# Patient Record
Sex: Male | Born: 1980 | Race: White | Hispanic: No | Marital: Single | Smoking: Former smoker
Health system: Southern US, Community
[De-identification: ages and names within clinical notes are randomized; demographics above are authoritative.]

## PROBLEM LIST (undated history)

## (undated) ENCOUNTER — Ambulatory Visit: Admission: EM | Payer: 59 | Source: Home / Self Care

## (undated) DIAGNOSIS — E119 Type 2 diabetes mellitus without complications: Secondary | ICD-10-CM

## (undated) DIAGNOSIS — E785 Hyperlipidemia, unspecified: Secondary | ICD-10-CM

## (undated) DIAGNOSIS — I10 Essential (primary) hypertension: Secondary | ICD-10-CM

## (undated) HISTORY — PX: APPENDECTOMY: SHX54

---

## 2004-04-07 ENCOUNTER — Emergency Department: Payer: Self-pay | Admitting: Emergency Medicine

## 2005-03-18 ENCOUNTER — Ambulatory Visit: Payer: Self-pay | Admitting: Gastroenterology

## 2005-12-07 ENCOUNTER — Emergency Department: Payer: Self-pay

## 2008-07-03 ENCOUNTER — Ambulatory Visit: Payer: Self-pay | Admitting: Family Medicine

## 2008-07-27 ENCOUNTER — Ambulatory Visit: Payer: Self-pay | Admitting: Family Medicine

## 2009-12-01 ENCOUNTER — Emergency Department: Payer: Self-pay | Admitting: Emergency Medicine

## 2010-12-22 ENCOUNTER — Ambulatory Visit: Payer: Self-pay

## 2014-05-10 ENCOUNTER — Ambulatory Visit: Payer: Self-pay | Admitting: Physician Assistant

## 2014-05-10 LAB — RAPID STREP-A WITH REFLX: Micro Text Report: NEGATIVE

## 2014-05-13 LAB — BETA STREP CULTURE(ARMC)

## 2015-09-09 ENCOUNTER — Ambulatory Visit (INDEPENDENT_AMBULATORY_CARE_PROVIDER_SITE_OTHER): Payer: Self-pay

## 2015-09-09 ENCOUNTER — Ambulatory Visit: Payer: Self-pay

## 2015-09-09 ENCOUNTER — Ambulatory Visit
Admission: EM | Admit: 2015-09-09 | Discharge: 2015-09-09 | Disposition: A | Discharge status: Home / Self Care | Payer: Self-pay | Attending: Family Medicine | Admitting: Family Medicine

## 2015-09-09 DIAGNOSIS — M25551 Pain in right hip: Secondary | ICD-10-CM

## 2015-09-09 DIAGNOSIS — M25552 Pain in left hip: Secondary | ICD-10-CM

## 2015-09-09 HISTORY — DX: Hyperlipidemia, unspecified: E78.5

## 2015-09-09 MED ORDER — OXYCODONE-ACETAMINOPHEN 5-325 MG PO TABS: 1.0000 | ORAL_TABLET | Freq: Three times a day (TID) | ORAL | Status: AC | PRN
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
## 2017-01-13 DIAGNOSIS — L400 Psoriasis vulgaris: Secondary | ICD-10-CM | POA: Diagnosis not present

## 2017-01-13 DIAGNOSIS — L918 Other hypertrophic disorders of the skin: Secondary | ICD-10-CM | POA: Diagnosis not present

## 2017-05-22 DIAGNOSIS — G4733 Obstructive sleep apnea (adult) (pediatric): Secondary | ICD-10-CM | POA: Diagnosis not present

## 2017-09-04 DIAGNOSIS — H6592 Unspecified nonsuppurative otitis media, left ear: Secondary | ICD-10-CM | POA: Diagnosis not present

## 2017-09-04 DIAGNOSIS — J029 Acute pharyngitis, unspecified: Secondary | ICD-10-CM | POA: Diagnosis not present

## 2017-10-22 DIAGNOSIS — R69 Illness, unspecified: Secondary | ICD-10-CM | POA: Diagnosis not present

## 2017-10-22 DIAGNOSIS — E782 Mixed hyperlipidemia: Secondary | ICD-10-CM | POA: Diagnosis not present

## 2017-10-22 DIAGNOSIS — H1132 Conjunctival hemorrhage, left eye: Secondary | ICD-10-CM | POA: Diagnosis not present

## 2017-12-10 DIAGNOSIS — G4733 Obstructive sleep apnea (adult) (pediatric): Secondary | ICD-10-CM | POA: Diagnosis not present

## 2017-12-16 DIAGNOSIS — G4733 Obstructive sleep apnea (adult) (pediatric): Secondary | ICD-10-CM | POA: Diagnosis not present

## 2017-12-17 DIAGNOSIS — G4733 Obstructive sleep apnea (adult) (pediatric): Secondary | ICD-10-CM | POA: Diagnosis not present

## 2018-01-27 ENCOUNTER — Ambulatory Visit
Admission: RE | Admit: 2018-01-27 | Discharge: 2018-01-27 | Disposition: A | Discharge status: Home / Self Care | Payer: 59 | Source: Ambulatory Visit | Attending: Family Medicine | Admitting: Family Medicine

## 2018-01-27 ENCOUNTER — Other Ambulatory Visit: Payer: Self-pay | Admitting: Family Medicine

## 2018-01-27 ENCOUNTER — Ambulatory Visit
Admission: RE | Admit: 2018-01-27 | Discharge: 2018-01-27 | Disposition: A | Discharge status: Home / Self Care | Payer: 59 | Source: Ambulatory Visit

## 2018-01-27 DIAGNOSIS — R0989 Other specified symptoms and signs involving the circulatory and respiratory systems: Secondary | ICD-10-CM

## 2018-01-27 DIAGNOSIS — J9811 Atelectasis: Secondary | ICD-10-CM | POA: Diagnosis not present

## 2018-01-27 DIAGNOSIS — Z87891 Personal history of nicotine dependence: Secondary | ICD-10-CM | POA: Diagnosis not present

## 2018-03-10 DIAGNOSIS — G4733 Obstructive sleep apnea (adult) (pediatric): Secondary | ICD-10-CM | POA: Diagnosis not present

## 2018-04-26 DIAGNOSIS — L400 Psoriasis vulgaris: Secondary | ICD-10-CM | POA: Diagnosis not present

## 2018-05-17 DIAGNOSIS — Z96641 Presence of right artificial hip joint: Secondary | ICD-10-CM | POA: Diagnosis not present

## 2018-05-17 DIAGNOSIS — M87052 Idiopathic aseptic necrosis of left femur: Secondary | ICD-10-CM | POA: Diagnosis not present

## 2018-05-17 DIAGNOSIS — Z471 Aftercare following joint replacement surgery: Secondary | ICD-10-CM | POA: Diagnosis not present

## 2018-06-22 DIAGNOSIS — G4733 Obstructive sleep apnea (adult) (pediatric): Secondary | ICD-10-CM | POA: Diagnosis not present

## 2018-07-14 DIAGNOSIS — B9689 Other specified bacterial agents as the cause of diseases classified elsewhere: Secondary | ICD-10-CM | POA: Diagnosis not present

## 2018-07-14 DIAGNOSIS — J019 Acute sinusitis, unspecified: Secondary | ICD-10-CM | POA: Diagnosis not present

## 2018-08-09 ENCOUNTER — Other Ambulatory Visit: Payer: Self-pay

## 2018-08-09 ENCOUNTER — Ambulatory Visit
Admission: EM | Admit: 2018-08-09 | Discharge: 2018-08-09 | Disposition: A | Discharge status: Home / Self Care | Payer: 59 | Attending: Family Medicine | Admitting: Family Medicine

## 2018-08-09 DIAGNOSIS — Z029 Encounter for administrative examinations, unspecified: Secondary | ICD-10-CM

## 2018-08-09 NOTE — ED Provider Notes (Signed)
[facility]    CSN: [id number] Arrival date & time: 08/09/18  1937     History   Chief Complaint Chief Complaint  Patient presents with  . Letter for School/Work    HPI TAHER WILCH is a 38 y.o. male.   38 yo male presents for work note. 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 Did not have any symptoms and feels fine, just needs a note so he can go back to work.   The history is provided by the patient.    Past Medical History:  Diagnosis Date  . Hyperlipemia     There are no active problems to display for this patient.   Past Surgical History:  Procedure Laterality Date  . APPENDECTOMY         Home Medications    Prior to Admission medications   Medication Sig Start Date End Date Taking? Authorizing Provider  cetirizine (ZYRTEC) 10 MG tablet Take 10 mg by mouth daily.    [provider]  oxyCODONE-acetaminophen (ROXICET) 5-325 MG tablet Take 1 tablet by mouth every 8 (eight) hours as needed for moderate pain or severe pain (Do not drive or operate heavy machinery while taking as can cause drowsiness.). 09/09/15   Renford Dills, NP  simvastatin (ZOCOR) 10 MG tablet Take 10 mg by mouth daily.    [provider]    Family History Family History  Problem Relation Age of Onset  . Kidney disease Mother     Social History Social History   Tobacco Use  . Smoking status: Current Every Day Smoker    Types: E-cigarettes, Cigars  . Smokeless tobacco: Never Used  Substance Use Topics  . Alcohol use: Yes  . Drug use: No     Allergies   Patient has no known allergies.   Review of Systems Review of Systems   Physical Exam Triage Vital Signs ED Triage Vitals  Enc Vitals Group     BP 08/09/18 1011 (!) 148/101     Pulse Rate 08/09/18 1011 100     Resp 08/09/18 1011 18     Temp 08/09/18 1011 99.1 F (37.3 C)     Temp Source 08/09/18 1011 Oral     SpO2 08/09/18 1011 100 %     Weight 08/09/18  1014 255 lb (115.7 kg)     Height 08/09/18 1014 5\' 11"  (1.803 m)     Head Circumference --      Peak Flow --      Pain Score 08/09/18 1014 0     Pain Loc --      Pain Edu? --      Excl. in GC? --    No data found.  Updated Vital Signs BP (!) 148/101 (BP Location: Right Arm)   Pulse 100   Temp 99.1 F (37.3 C) (Oral)   Resp 18   Ht 5\' 11"  (1.803 m)   Wt 115.7 kg   SpO2 100%   BMI 35.57 kg/m   Visual Acuity Right Eye Distance:   Left Eye Distance:   Bilateral Distance:    Right Eye Near:   Left Eye Near:    Bilateral Near:     Physical Exam Vitals signs and nursing note reviewed.      UC Treatments / Results  Labs (all labs ordered are listed, but only abnormal results are displayed) Labs Reviewed - No data to display  EKG None  Radiology No results  found.  Procedures Procedures (including critical care time)  Medications Ordered in UC Medications - No data to display  Initial Impression / Assessment and Plan / UC Course  I have reviewed the triage vital signs and the nursing notes.  Pertinent labs & imaging results that were available during my care of the patient were reviewed by me and considered in my medical decision making (see chart for details).      Final Clinical Impressions(s) / UC Diagnoses   Final diagnoses:  Administrative encounter    ED Prescriptions    None      1. Note printed for return to work.   Controlled Substance Prescriptions [place] Controlled Substance Registry consulted? Not Applicable   Payton Mccallum, MD 08/09/18 1147
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
## 2019-01-06 DIAGNOSIS — Z79899 Other long term (current) drug therapy: Secondary | ICD-10-CM | POA: Diagnosis not present

## 2019-01-06 DIAGNOSIS — L400 Psoriasis vulgaris: Secondary | ICD-10-CM | POA: Diagnosis not present

## 2019-01-12 DIAGNOSIS — G4733 Obstructive sleep apnea (adult) (pediatric): Secondary | ICD-10-CM | POA: Diagnosis not present

## 2019-02-03 DIAGNOSIS — L400 Psoriasis vulgaris: Secondary | ICD-10-CM | POA: Diagnosis not present

## 2019-02-07 IMAGING — CR DG CHEST 2V
3 series · 3 of 3 positions shown · non-contrast
Comparison: 12/01/2009

CLINICAL DATA: Gurgling or bubbling sensation left posterior chest

EXAM:
CHEST - 2 VIEW

[chest pa]
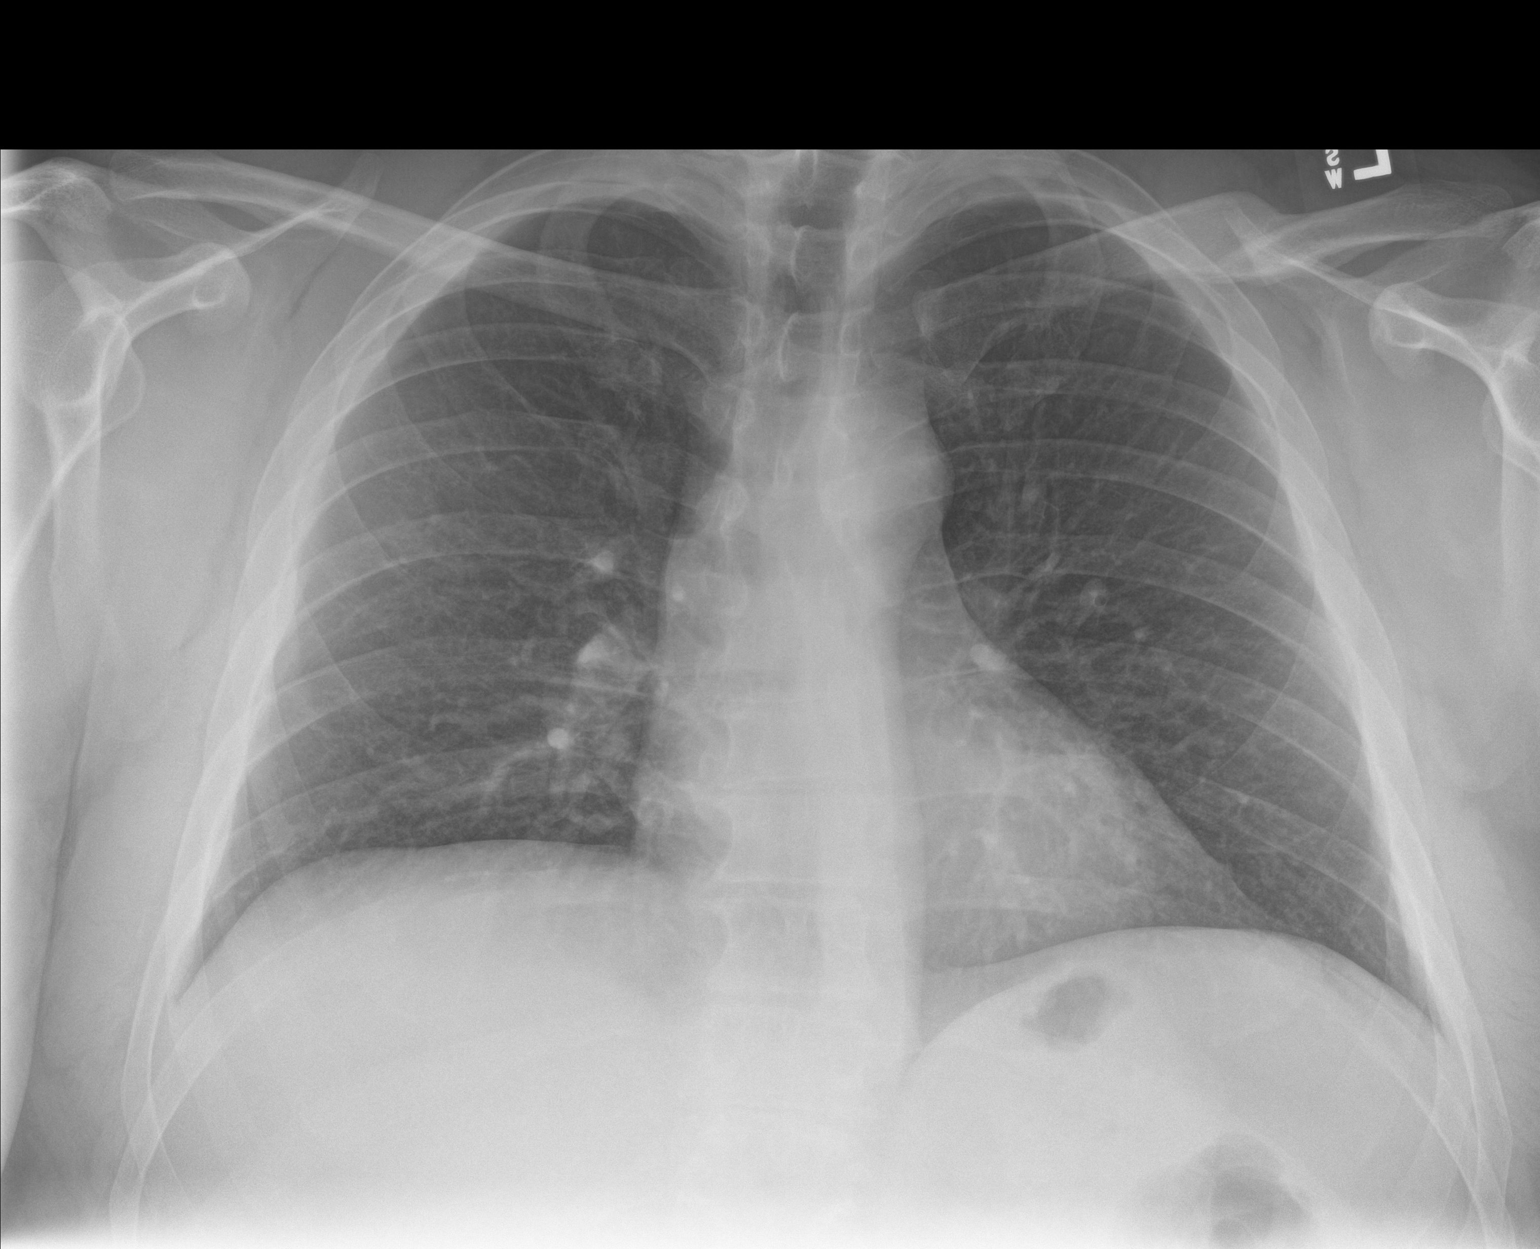

[chest lat (1 of 2)]
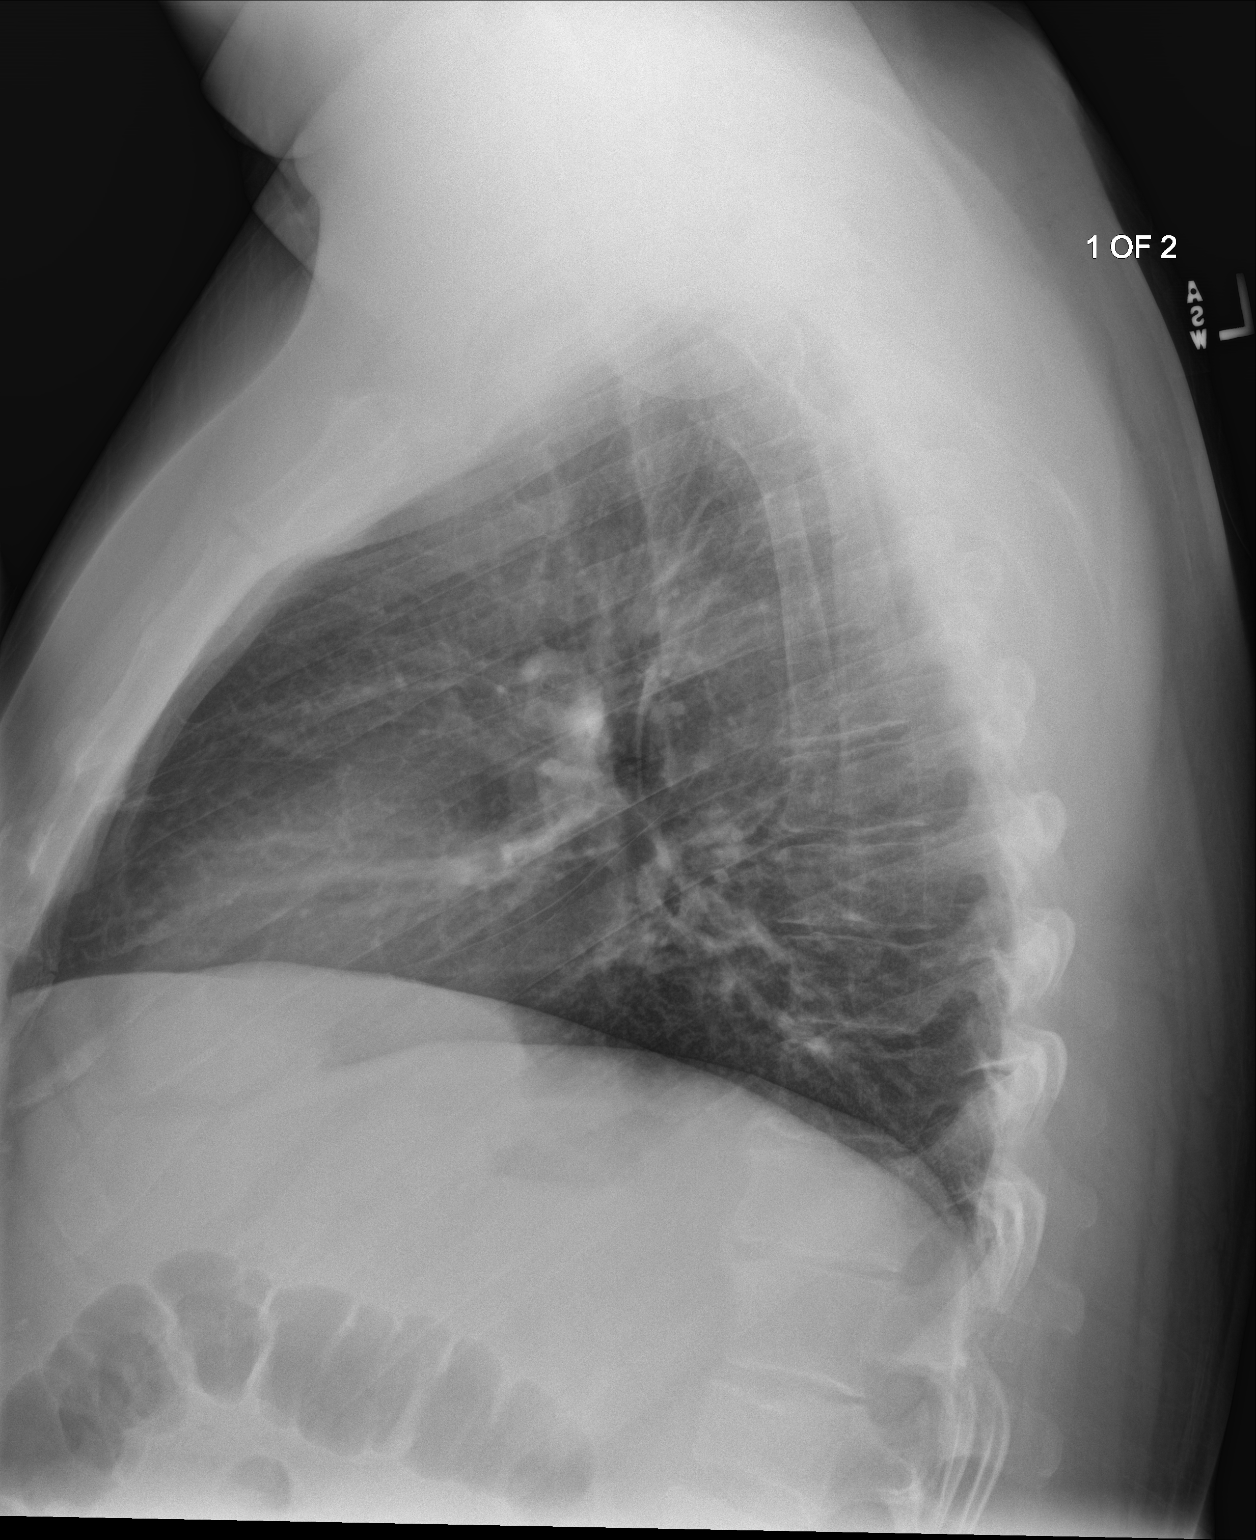

[chest lat (2 of 2)]
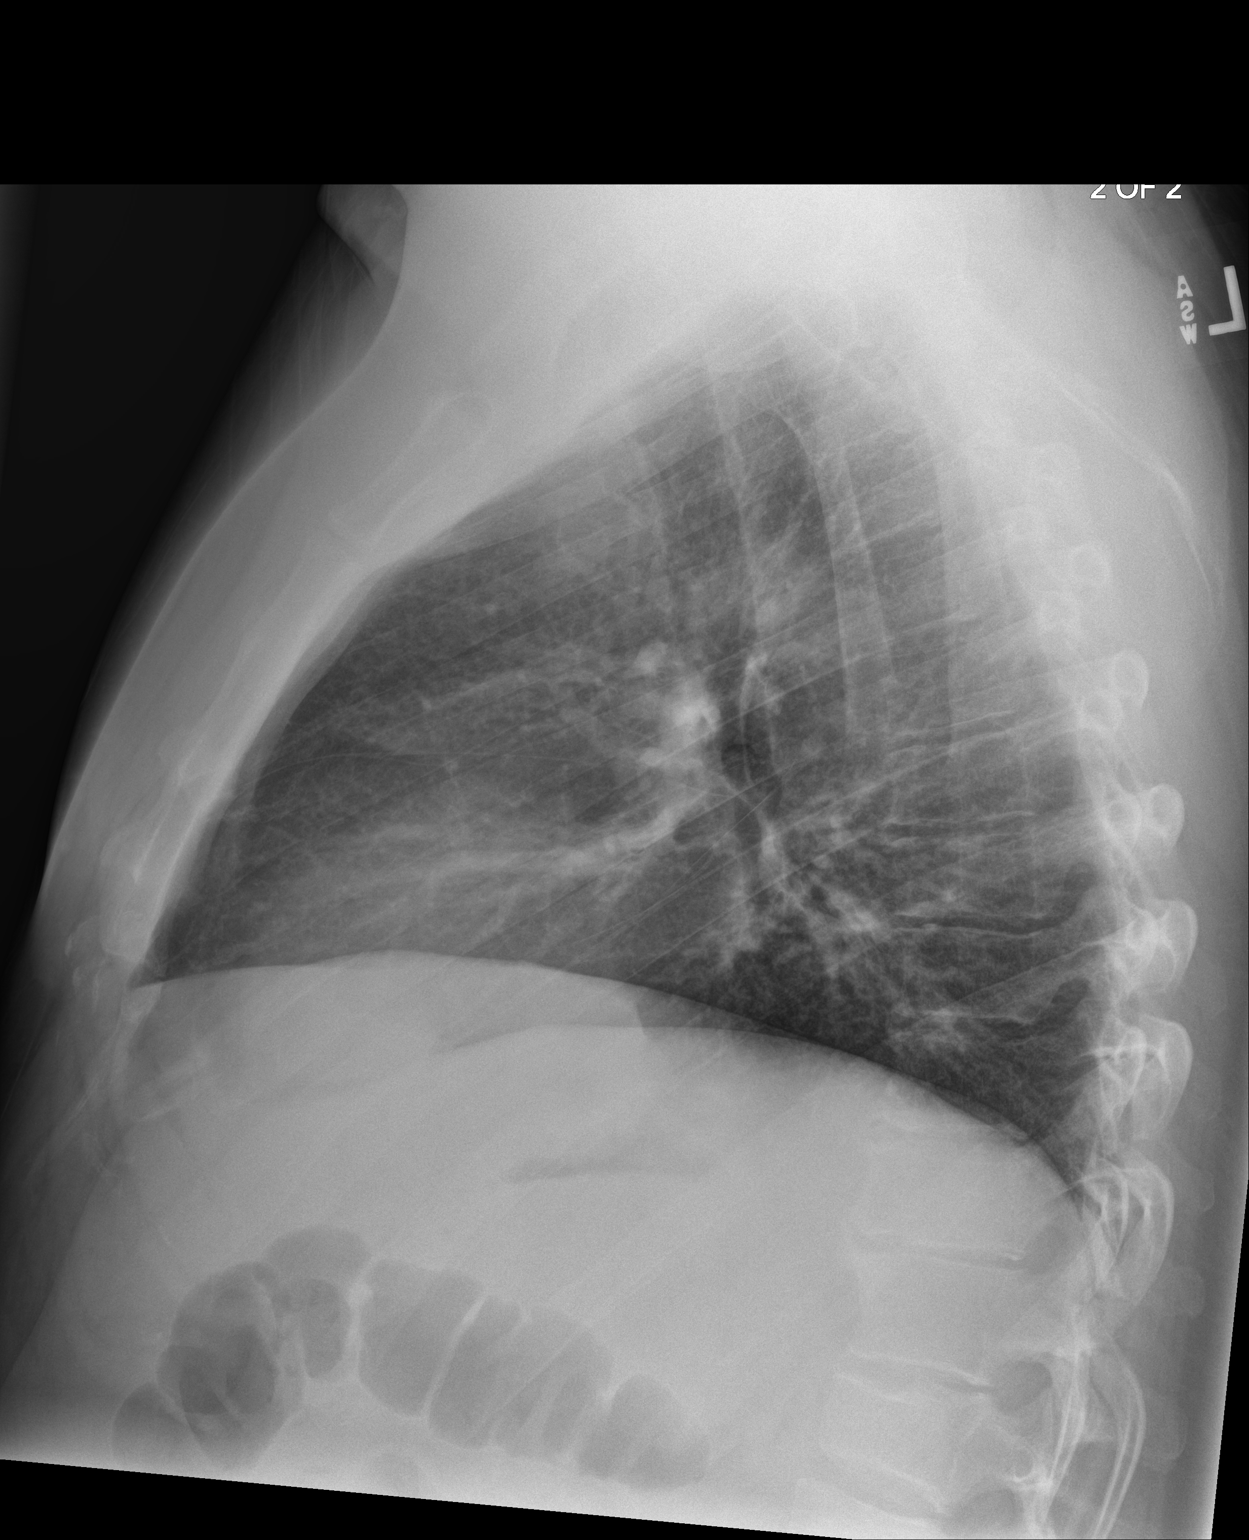

[3 of 3 positions shown; findings below may reference images not displayed]

FINDINGS: Streaky atelectasis at the left base. No focal consolidation or
effusion. Normal heart size. No pneumothorax.
IMPRESSION: Streaky left basilar atelectasis.

## 2019-02-10 DIAGNOSIS — L400 Psoriasis vulgaris: Secondary | ICD-10-CM | POA: Diagnosis not present

## 2019-02-11 DIAGNOSIS — G4733 Obstructive sleep apnea (adult) (pediatric): Secondary | ICD-10-CM | POA: Diagnosis not present

## 2019-03-14 DIAGNOSIS — G4733 Obstructive sleep apnea (adult) (pediatric): Secondary | ICD-10-CM | POA: Diagnosis not present

## 2019-04-12 DIAGNOSIS — G4733 Obstructive sleep apnea (adult) (pediatric): Secondary | ICD-10-CM | POA: Diagnosis not present

## 2019-05-13 DIAGNOSIS — G4733 Obstructive sleep apnea (adult) (pediatric): Secondary | ICD-10-CM | POA: Diagnosis not present

## 2019-05-20 DIAGNOSIS — E782 Mixed hyperlipidemia: Secondary | ICD-10-CM | POA: Diagnosis not present

## 2019-05-20 DIAGNOSIS — I10 Essential (primary) hypertension: Secondary | ICD-10-CM | POA: Diagnosis not present

## 2019-05-27 DIAGNOSIS — M87052 Idiopathic aseptic necrosis of left femur: Secondary | ICD-10-CM | POA: Diagnosis not present

## 2022-04-11 ENCOUNTER — Encounter: Payer: Self-pay | Admitting: Emergency Medicine

## 2022-04-11 ENCOUNTER — Ambulatory Visit
Admission: EM | Admit: 2022-04-11 | Discharge: 2022-04-11 | Disposition: A | Discharge status: Home / Self Care | Payer: 59

## 2022-04-11 DIAGNOSIS — J019 Acute sinusitis, unspecified: Secondary | ICD-10-CM

## 2022-04-11 DIAGNOSIS — J209 Acute bronchitis, unspecified: Secondary | ICD-10-CM

## 2022-04-11 DIAGNOSIS — B9689 Other specified bacterial agents as the cause of diseases classified elsewhere: Secondary | ICD-10-CM

## 2022-04-11 HISTORY — DX: Type 2 diabetes mellitus without complications: E11.9

## 2022-04-11 HISTORY — DX: Essential (primary) hypertension: I10

## 2022-04-11 MED ORDER — PREDNISONE 20 MG PO TABS: ORAL_TABLET | ORAL | 0 refills | Status: AC

## 2022-04-11 MED ORDER — AMOXICILLIN-POT CLAVULANATE 875-125 MG PO TABS: 1.0000 | ORAL_TABLET | Freq: Two times a day (BID) | ORAL | 0 refills | Status: DC

## 2022-04-11 NOTE — Discharge Instructions (Signed)
Follow up here or with your primary care provider if your symptoms are worsening or not improving with treatment.     

## 2022-04-11 NOTE — ED Triage Notes (Signed)
Patient c/o sinus congestion and pressure for a month.  Patient c/o cough and chest congestion that has gotten worse over the past week.  Patient denies fevers.

## 2022-04-11 NOTE — ED Provider Notes (Signed)
[facility]    CSN: [id number] Arrival date & time: 04/11/22  0803      History   Chief Complaint Chief Complaint  Patient presents with   Cough    HPI Miguel Zamora is a 41 y.o. male.    Cough   Presents to urgent care with complaint of sinus congestion and pressure for 1 week.  He complains of cough and chest congestion worsening over the past 1 month.  Endorses pain and popping in his ears.  Sinus pressure in his face especially under his eyes.  Cough is worse in the morning and productive.  Denies cough keeps him awake at night.  Denies fever, chills, myalgias.  Denies nausea, vomiting, diarrhea.  Past Medical History:  Diagnosis Date   Diabetes mellitus without complication (HCC)    Hyperlipemia    Hypertension     There are no problems to display for this patient.   Past Surgical History:  Procedure Laterality Date   APPENDECTOMY         Home Medications    Prior to Admission medications   Medication Sig Start Date End Date Taking? Authorizing Provider  carvedilol (COREG) 6.25 MG tablet Take by mouth. 03/28/22  Yes [provider]  Ferrous Sulfate (IRON PO) Take 1 tablet by mouth daily. 04/30/16  Yes [provider]  metFORMIN (GLUCOPHAGE) 1000 MG tablet Take by mouth. 03/28/22  Yes [provider]  TREMFYA 100 MG/ML SOPN Inject into the skin.   Yes [provider]  cetirizine (ZYRTEC) 10 MG tablet Take 10 mg by mouth daily.    [provider]  oxyCODONE-acetaminophen (ROXICET) 5-325 MG tablet Take 1 tablet by mouth every 8 (eight) hours as needed for moderate pain or severe pain (Do not drive or operate heavy machinery while taking as can cause drowsiness.). 09/09/15   Renford Dills, NP  simvastatin (ZOCOR) 10 MG tablet Take 10 mg by mouth daily.    [provider]    Family History Family History  Problem Relation Age of Onset   Kidney disease Mother     Social  History Social History   Tobacco Use   Smoking status: Former    Types: E-cigarettes, Cigars   Smokeless tobacco: Never  Vaping Use   Vaping Use: Every day   Substances: Nicotine  Substance Use Topics   Alcohol use: Yes   Drug use: No     Allergies   Patient has no known allergies.   Review of Systems Review of Systems  Respiratory:  Positive for cough.      Physical Exam Triage Vital Signs ED Triage Vitals  Enc Vitals Group     BP 04/11/22 0828 (!) 137/92     Pulse Rate 04/11/22 0828 90     Resp 04/11/22 0828 15     Temp 04/11/22 0828 99 F (37.2 C)     Temp Source 04/11/22 0828 Oral     SpO2 04/11/22 0828 96 %     Weight 04/11/22 0827 245 lb (111.1 kg)     Height 04/11/22 0827 5\' 11"  (1.803 m)     Head Circumference --      Peak Flow --      Pain Score 04/11/22 0827 0     Pain Loc --      Pain Edu? --      Excl. in GC? --    No data found.  Updated Vital Signs BP (!) 137/92 (BP Location: Right Arm)  Pulse 90   Temp 99 F (37.2 C) (Oral)   Resp 15   Ht 5\' 11"  (1.803 m)   Wt 245 lb (111.1 kg)   SpO2 96%   BMI 34.17 kg/m   Visual Acuity Right Eye Distance:   Left Eye Distance:   Bilateral Distance:    Right Eye Near:   Left Eye Near:    Bilateral Near:     Physical Exam Vitals reviewed.  Constitutional:      Appearance: Normal appearance.  HENT:     Right Ear: Tympanic membrane is not erythematous.     Left Ear: Tympanic membrane is not erythematous.     Ears:     Comments: Left TM is abnormal appearing. 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 Cardiovascular:     Rate and Rhythm: Normal rate and regular rhythm.     Pulses: Normal pulses.     Heart sounds: Normal heart sounds.  Pulmonary:     Effort: Pulmonary effort is normal.     Breath sounds: Normal breath sounds.  Skin:    General: Skin is warm and dry.  Neurological:     General: No focal deficit present.     Mental Status: He is alert and  oriented to person, place, and time.  Psychiatric:        Mood and Affect: Mood normal.        Behavior: Behavior normal.      UC Treatments / Results  Labs (all labs ordered are listed, but only abnormal results are displayed) Labs Reviewed - No data to display  EKG   Radiology No results found.  Procedures Procedures (including critical care time)  Medications Ordered in UC Medications - No data to display  Initial Impression / Assessment and Plan / UC Course  I have reviewed the triage vital signs and the nursing notes.  Pertinent labs & imaging results that were available during my care of the patient were reviewed by me and considered in my medical decision making (see chart for details).   Patient is afebrile here without recent antipyretics. Satting well on room air. Overall is well appearing, well hydrated, without respiratory distress. Pulmonary exam is unremarkable.  Lungs CTAB without wheezing or rhonchi.  Left TM is abnormal appearing.  Patient endorses history of TM perforation possibly secondary to multiple TM tubes placed during childhood.  There is no TM erythema or purulence.  Some pharyngeal erythema with tonsils 2+ bilaterally.  No peritonsillar exudate is observed.  Suspect past viral URI with cough.  Concern for secondary bacterial infection given duration of symptoms and will treat with Augmentin x 7 days.  Given severity of sinus symptoms, will also add a course of prednisone to reduce sinus inflammation.  Final Clinical Impressions(s) / UC Diagnoses   Final diagnoses:  None   Discharge Instructions   None    ED Prescriptions   None    PDMP not reviewed this encounter.   , Charma Igo 04/11/22 [phone]

## 2022-04-26 ENCOUNTER — Ambulatory Visit: Admit: 2022-04-26 | Discharge: 2022-04-26 | Discharge status: Absent without leave | Payer: 59

## 2022-04-28 ENCOUNTER — Ambulatory Visit
Admission: EM | Admit: 2022-04-28 | Discharge: 2022-04-28 | Disposition: A | Discharge status: Home / Self Care | Payer: 59 | Attending: Family Medicine | Admitting: Family Medicine

## 2022-04-28 ENCOUNTER — Ambulatory Visit (INDEPENDENT_AMBULATORY_CARE_PROVIDER_SITE_OTHER): Payer: 59

## 2022-04-28 VITALS — BP 159/97 | HR 103 | Temp 98.7°F | Resp 16 | Ht 71.0 in | Wt 244.9 lb

## 2022-04-28 DIAGNOSIS — J40 Bronchitis, not specified as acute or chronic: Secondary | ICD-10-CM

## 2022-04-28 DIAGNOSIS — R059 Cough, unspecified: Secondary | ICD-10-CM

## 2022-04-28 MED ORDER — AZITHROMYCIN 250 MG PO TABS: 250.0000 mg | ORAL_TABLET | Freq: Every day | ORAL | 0 refills | Status: AC

## 2022-04-28 MED ORDER — PREDNISONE 50 MG PO TABS: 50.0000 mg | ORAL_TABLET | Freq: Every day | ORAL | 0 refills | Status: AC

## 2022-04-28 MED ORDER — ALBUTEROL SULFATE HFA 108 (90 BASE) MCG/ACT IN AERS: 2.0000 | INHALATION_SPRAY | RESPIRATORY_TRACT | 0 refills | Status: AC | PRN

## 2022-04-28 NOTE — ED Triage Notes (Signed)
Pt c/o cough. He was seen for this on 04/11/22 and treated with abx. He states he is feeling better but he still has some chest congestion and cough.

## 2022-04-28 NOTE — ED Provider Notes (Signed)
[facility]    CSN: [id number] Arrival date & time: 04/28/22  1554      History   Chief Complaint Chief Complaint  Patient presents with   Appointment   Cough    HPI Miguel Zamora is a 41 y.o. male.   HPI   Miguel Zamora presents for ongoing cough for the past 1-2 months. States his son has a similar cough.  Took antibiotics and steroids which helped but did not take the cough completely away.  Cough is worse early in the morning.   No  new medications or supplements.  No reflux symptoms.  Has been using over-the-counter cough medicines without relief.  There is no shortness of breath, chest discomfort, vomiting, nausea.     Past Medical History:  Diagnosis Date   Diabetes mellitus without complication (HCC)    Hyperlipemia    Hypertension     There are no problems to display for this patient.   Past Surgical History:  Procedure Laterality Date   APPENDECTOMY         Home Medications    Prior to Admission medications   Medication Sig Start Date End Date Taking? Authorizing Provider  albuterol (VENTOLIN HFA) 108 (90 Base) MCG/ACT inhaler Inhale 2 puffs into the lungs every 4 (four) hours as needed. 04/28/22  Yes Kayvon Mo, DO  azithromycin (ZITHROMAX Z-PAK) 250 MG tablet Take 1 tablet (250 mg total) by mouth daily. Take 2 tablets on day 1 04/28/22  Yes Taysen Bushart, DO  carvedilol (COREG) 6.25 MG tablet Take by mouth. 03/28/22  Yes [provider]  Ferrous Sulfate (IRON PO) Take 1 tablet by mouth daily. 04/30/16  Yes [provider]  fexofenadine (ALLEGRA) 60 MG tablet Take 60 mg by mouth 2 (two) times daily.   Yes [provider]  metFORMIN (GLUCOPHAGE) 1000 MG tablet Take by mouth. 03/28/22  Yes [provider]  predniSONE (DELTASONE) 50 MG tablet Take 1 tablet (50 mg total) by mouth daily for 5 days. 04/28/22 05/03/22 Yes Keshaun Dubey, DO  simvastatin (ZOCOR) 10 MG tablet Take 10 mg by mouth daily.   Yes  [provider]  TREMFYA 100 MG/ML SOPN Inject into the skin.   Yes [provider]  cetirizine (ZYRTEC) 10 MG tablet Take 10 mg by mouth daily.    [provider]  oxyCODONE-acetaminophen (ROXICET) 5-325 MG tablet Take 1 tablet by mouth every 8 (eight) hours as needed for moderate pain or severe pain (Do not drive or operate heavy machinery while taking as can cause drowsiness.). 09/09/15   Renford Dills, NP    Family History Family History  Problem Relation Age of Onset   Kidney disease Mother     Social History Social History   Tobacco Use   Smoking status: Former    Types: E-cigarettes, Cigars   Smokeless tobacco: Never  Vaping Use   Vaping Use: Every day   Substances: Nicotine  Substance Use Topics   Alcohol use: Yes   Drug use: No     Allergies   Patient has no known allergies.   Review of Systems Review of Systems: negative unless otherwise stated in HPI.      Physical Exam Triage Vital Signs ED Triage Vitals  Enc Vitals Group     BP 04/28/22 1625 (!) 159/97     Pulse Rate 04/28/22 1625 (!) 103     Resp 04/28/22 1625 16     Temp 04/28/22 1625 98.7 F (37.1 C)  Temp src --      SpO2 04/28/22 1625 97 %     Weight 04/28/22 1625 244 lb 14.9 oz (111.1 kg)     Height 04/28/22 1625 5\' 11"  (1.803 m)     Head Circumference --      Peak Flow --      Pain Score 04/28/22 1624 0     Pain Loc --      Pain Edu? --      Excl. in GC? --    No data found.  Updated Vital Signs BP (!) 159/97 (BP Location: Right Arm)   Pulse (!) 103   Temp 98.7 F (37.1 C)   Resp 16   Ht 5\' 11"  (1.803 m)   Wt 111.1 kg   SpO2 97%   BMI 34.16 kg/m   Visual Acuity Right Eye Distance:   Left Eye Distance:   Bilateral Distance:    Right Eye Near:   Left Eye Near:    Bilateral Near:     Physical Exam GEN:     alert, non-toxic appearing male in no distress    HENT:  mucus membranes moist, oropharyngeal without lesions or exudate, no***  tonsillar hypertrophy, *** mild oropharyngeal erythema , *** moderate erythematous edematous turbinates, ***clear nasal discharge, ***bilateral TM normal EYES:   pupils equal and reactive, ***no scleral injection or discharge NECK:  normal ROM, no ***lymphadenopathy, ***no meningismus   RESP:  no increased work of breathing, ***clear to auscultation bilaterally CVS:   regular rate ***and rhythm Skin:   warm and dry, no rash on visible skin***    UC Treatments / Results  Labs (all labs ordered are listed, but only abnormal results are displayed) Labs Reviewed - No data to display  EKG   Radiology DG Chest 2 View  Result Date: 04/28/2022 CLINICAL DATA:  Cough for 1 month, treated with antibiotic therapy EXAM: CHEST - 2 VIEW COMPARISON:  01/27/2018 chest radiograph. FINDINGS: Stable cardiomediastinal silhouette with normal heart size. No pneumothorax. No pleural effusion. Lungs appear clear, with no acute consolidative airspace disease and no pulmonary edema. IMPRESSION: No active cardiopulmonary disease. Electronically Signed   By: 04/30/2022 M.D.   On: 04/28/2022 17:32    Procedures Procedures (including critical care time)  Medications Ordered in UC Medications - No data to display  Initial Impression / Assessment and Plan / UC Course  I have reviewed the triage vital signs and the nursing notes.  Pertinent labs & imaging results that were available during my care of the patient were reviewed by me and considered in my medical decision making (see chart for details).       Pt is a 41 y.o. male who presents for *** days of respiratory symptoms. [name] is ***afebrile here without recent antipyretics. Satting well on room air. Overall pt is ***non-toxic appearing, well hydrated, without respiratory distress. Pulmonary exam ***is unremarkable.  COVID and influenza testing obtained ***and was negative. ***Pt to quarantine until COVID test results or longer if positive.  I will call  patient with test results, if positive. History consistent with ***viral respiratory illness. Discussed symptomatic treatment.  Explained lack of efficacy of antibiotics in viral disease.  Typical duration of symptoms discussed.   Return and ED precautions given and voiced understanding. Discussed MDM, treatment plan and plan for follow-up with patient/guardian*** who agrees with plan.     Final Clinical Impressions(s) / UC Diagnoses   Final diagnoses:  Bronchitis     Discharge Instructions  Your chest xray did not show a pneumonia.  I sent steroids and antibiotics to your pharmacy.  I also gave you an albuterol inhaler.    ED Prescriptions     Medication Sig Dispense Auth. Provider   azithromycin (ZITHROMAX Z-PAK) 250 MG tablet Take 1 tablet (250 mg total) by mouth daily. Take 2 tablets on day 1 6 tablet Aryahna Spagna, DO   predniSONE (DELTASONE) 50 MG tablet Take 1 tablet (50 mg total) by mouth daily for 5 days. 5 tablet Zita Ozimek, DO   albuterol (VENTOLIN HFA) 108 (90 Base) MCG/ACT inhaler Inhale 2 puffs into the lungs every 4 (four) hours as needed. 6.7 g Katha Cabal, DO      PDMP not reviewed this encounter.

## 2022-04-28 NOTE — Discharge Instructions (Signed)
Your chest xray did not show a pneumonia.  I sent steroids and antibiotics to your pharmacy.  I also gave you an albuterol inhaler.

## 2024-06-15 ENCOUNTER — Ambulatory Visit
Admission: EM | Admit: 2024-06-15 | Discharge: 2024-06-15 | Disposition: A | Discharge status: Home / Self Care | Source: Home / Self Care | Attending: Family Medicine | Admitting: Family Medicine

## 2024-06-15 DIAGNOSIS — J111 Influenza due to unidentified influenza virus with other respiratory manifestations: Secondary | ICD-10-CM

## 2024-06-15 DIAGNOSIS — J069 Acute upper respiratory infection, unspecified: Secondary | ICD-10-CM

## 2024-06-15 LAB — POCT INFLUENZA A/B
Influenza A, POC: NEGATIVE
Influenza B, POC: NEGATIVE

## 2024-06-15 LAB — POC SOFIA SARS ANTIGEN FIA: SARS Coronavirus 2 Ag: NEGATIVE

## 2024-06-15 MED ORDER — IPRATROPIUM BROMIDE 0.06 % NA SOLN: 2.0000 | Freq: Four times a day (QID) | NASAL | 0 refills | Status: AC

## 2024-06-15 NOTE — ED Provider Notes (Incomplete)
 " [facility]    CSN: [id number] Arrival date & time: 06/15/24  1018      History   Chief Complaint Chief Complaint  Patient presents with   Nasal Congestion    HPI Miguel Zamora is a 44 y.o. male.   HPI  History obtained from the patient. Axcel presents for shortness of breath, cough, sore throat, sinus pressure, nasal congestion, fever, diarrhea and  vomiting for the past 1-2 days.  Has chills and hot flashes at night.  No known sick contacts.     No history of asthma. Denies  smoking. Has decreased vaping since he has been sick.        Past Medical History:  Diagnosis Date   Diabetes mellitus without complication (HCC)    Hyperlipemia    Hypertension     There are no active problems to display for this patient.   Past Surgical History:  Procedure Laterality Date   APPENDECTOMY         Home Medications    Prior to Admission medications  Medication Sig Start Date End Date Taking? Authorizing Provider  carvedilol (COREG) 6.25 MG tablet Take by mouth. 03/28/22  Yes [provider]  ipratropium (ATROVENT ) 0.06 % nasal spray Place 2 sprays into both nostrils 4 (four) times daily. 06/15/24  Yes Shafiq Larch, DO  albuterol  (VENTOLIN  HFA) 108 (90 Base) MCG/ACT inhaler Inhale 2 puffs into the lungs every 4 (four) hours as needed. 04/28/22   Gaius Ishaq, DO  azithromycin  (ZITHROMAX  Z-PAK) 250 MG tablet Take 1 tablet (250 mg total) by mouth daily. Take 2 tablets on day 1 Patient not taking: Reported on 06/15/2024 04/28/22   Chrystel Barefield, DO  cetirizine (ZYRTEC) 10 MG tablet Take 10 mg by mouth daily.    [provider]  Ferrous Sulfate (IRON PO) Take 1 tablet by mouth daily. Patient not taking: Reported on 06/15/2024 04/30/16   [provider]  fexofenadine (ALLEGRA) 60 MG tablet Take 60 mg by mouth 2 (two) times daily.    [provider]  FLUoxetine (PROZAC) 10 MG capsule Take 10 mg by mouth daily.    [provider]  metFORMIN (GLUCOPHAGE) 1000 MG tablet Take by mouth. 03/28/22   [provider]  oxyCODONE -acetaminophen  (ROXICET) 5-325 MG tablet Take 1 tablet by mouth every 8 (eight) hours as needed for moderate pain or severe pain (Do not drive or operate heavy machinery while taking as can cause drowsiness.). Patient not taking: Reported on 06/15/2024 09/09/15   Cleotilde Jacobsen, NP  simvastatin (ZOCOR) 10 MG tablet Take 10 mg by mouth daily.    [provider]  TREMFYA 100 MG/ML SOPN Inject into the skin.    [provider]    Family History Family History  Problem Relation Age of Onset   Kidney disease Mother     Social History Social History[1]   Allergies   Patient has no known allergies.   Review of Systems Review of Systems: negative unless otherwise stated in HPI.      Physical Exam Triage Vital Signs ED Triage Vitals  Encounter Vitals Group     BP 06/15/24 1055 (!) 150/102     Girls Systolic BP Percentile --      Girls Diastolic BP Percentile --      Boys Systolic BP Percentile --      Boys Diastolic BP Percentile --      Pulse Rate 06/15/24 1055 (!) 105     Resp  06/15/24 1055 16     Temp 06/15/24 1055 98.5 F (36.9 C)     Temp Source 06/15/24 1055 Oral     SpO2 06/15/24 1055 99 %     Weight 06/15/24 1055 230 lb (104.3 kg)     Height --      Head Circumference --      Peak Flow --      Pain Score 06/15/24 1102 2     Pain Loc --      Pain Education --      Exclude from Growth Chart --    No data found.  Updated Vital Signs BP (!) 150/102 (BP Location: Left Arm)   Pulse (!) 105   Temp 98.5 F (36.9 C) (Oral)   Resp 16   Wt 104.3 kg   SpO2 99%   BMI 32.08 kg/m   Visual Acuity Right Eye Distance:   Left Eye Distance:   Bilateral Distance:    Right Eye Near:   Left Eye Near:    Bilateral Near:     Physical Exam GEN:     alert, non-toxic appearing male in no distress    HENT:  mucus membranes moist, oropharyngeal  without lesions or erythema, no tonsillar hypertrophy or exudates,  moderate erythematous edematous turbinates, clear nasal discharge EYES:    no scleral injection or discharge NECK:  normal ROM, no meningismus   RESP:  no increased work of breathing, clear to auscultation bilaterally CVS:   regular rate and rhythm Skin:   warm and dry    UC Treatments / Results  Labs (all labs ordered are listed, but only abnormal results are displayed) Labs Reviewed  POCT INFLUENZA A/B - Normal  POC SOFIA SARS ANTIGEN FIA - Normal    EKG   Radiology No results found.   Procedures Procedures (including critical care time)  Medications Ordered in UC Medications - No data to display  Initial Impression / Assessment and Plan / UC Course  I have reviewed the triage vital signs and the nursing notes.  Pertinent labs & imaging results that were available during my care of the patient were reviewed by me and considered in my medical decision making (see chart for details).       Pt is a 44 y.o. male who presents for 1-2  days of respiratory symptoms. Abou is afebrile here without recent antipyretics. Satting well on room air. Overall pt is non-toxic appearing, well hydrated, without respiratory distress. Pulmonary exam is unremarkable.  POC COVID and POC influenza obtained and was negative.   Suspect viral respiratory illness. Discussed symptomatic treatment. Atrovent  nasal spray for nasal congestion.  Explained lack of efficacy of antibiotics in viral disease.  Typical duration of symptoms discussed.  Partner notes they have Zofran at home.  Return and ED precautions given and voiced understanding. Discussed MDM, treatment plan and plan for follow-up with patient who agrees with plan.     Final Clinical Impressions(s) / UC Diagnoses   Final diagnoses:  Viral URI with cough  Influenza-like illness     Discharge Instructions      Your influenza and COVID are negative.  You most  likely have a viral respiratory infection that will gradually improve over the next 7-10 days. Cough may last up to 3 weeks. If your were prescribed medication, stop by the pharmacy to pick them up.  You can take Tylenol  and/or Ibuprofen as needed for fever reduction and pain relief.    For cough:  honey 1/2 to 1 teaspoon (you can dilute the honey in water or another fluid). You can also use guaifenesin and dextromethorphan for cough.  You can use a humidifier for chest congestion and cough.  If you don't have a humidifier, you can sit in the bathroom with the hot shower running.      For congestion: take a daily anti-histamine like Zyrtec, Claritin, and a oral decongestant, such as pseudoephedrine.  Saline nasal sprays can be used frequently to clear nasal congestion. Pick up the prescription nasal spray from the pharmacy.    It is important to stay hydrated: drink plenty of fluids (water, gatorade/powerade/pedialyte, juices, or teas) to keep your throat moisturized and help further relieve irritation/discomfort.    Return or go to the Emergency Department if symptoms worsen or do not improve in the next few days        ED Prescriptions     Medication Sig Dispense Auth. Provider   ipratropium (ATROVENT ) 0.06 % nasal spray Place 2 sprays into both nostrils 4 (four) times daily. 15 mL Christepher Melchior, DO      PDMP not reviewed this encounter.    Kriste Berth, DO 06/17/24 1909     [1]  Social History Tobacco Use   Smoking status: Former    Types: E-cigarettes, Cigars   Smokeless tobacco: Never  Vaping Use   Vaping status: Every Day   Substances: Nicotine  Substance Use Topics   Alcohol use: Yes   Drug use: No     Kriste Berth, DO 06/17/24 1909  "

## 2024-06-15 NOTE — Discharge Instructions (Signed)
 Your influenza and COVID are negative.  You most likely have a viral respiratory infection that will gradually improve over the next 7-10 days. Cough may last up to 3 weeks. If your were prescribed medication, stop by the pharmacy to pick them up.  You can take Tylenol  and/or Ibuprofen as needed for fever reduction and pain relief.    For cough: honey 1/2 to 1 teaspoon (you can dilute the honey in water or another fluid). You can also use guaifenesin and dextromethorphan for cough.  You can use a humidifier for chest congestion and cough.  If you don't have a humidifier, you can sit in the bathroom with the hot shower running.      For congestion: take a daily anti-histamine like Zyrtec, Claritin, and a oral decongestant, such as pseudoephedrine.  Saline nasal sprays can be used frequently to clear nasal congestion. Pick up the prescription nasal spray from the pharmacy.    It is important to stay hydrated: drink plenty of fluids (water, gatorade/powerade/pedialyte, juices, or teas) to keep your throat moisturized and help further relieve irritation/discomfort.    Return or go to the Emergency Department if symptoms worsen or do not improve in the next few days

## 2024-06-15 NOTE — ED Triage Notes (Signed)
 Patient to Urgent Care with complaints of nasal congestion/ sore throat/ body aches/ SHOB/ cough/ one episode of emesis this morning/ nausea. Temp 100 this morning.   Symptoms started Monday.  Taking tylenol .
